# Patient Record
Sex: Female | Born: 1995 | Race: Black or African American | Hispanic: No | Marital: Single | Smoking: Never smoker
Health system: Southern US, Community
[De-identification: ages and names within clinical notes are randomized; demographics above are authoritative.]

## PROBLEM LIST (undated history)

## (undated) DIAGNOSIS — T7840XA Allergy, unspecified, initial encounter: Secondary | ICD-10-CM

## (undated) DIAGNOSIS — J45909 Unspecified asthma, uncomplicated: Secondary | ICD-10-CM

## (undated) HISTORY — DX: Unspecified asthma, uncomplicated: J45.909

## (undated) HISTORY — DX: Allergy, unspecified, initial encounter: T78.40XA

## (undated) HISTORY — PX: KNEE SURGERY: SHX244

---

## 2014-10-20 ENCOUNTER — Ambulatory Visit (INDEPENDENT_AMBULATORY_CARE_PROVIDER_SITE_OTHER): Payer: BLUE CROSS/BLUE SHIELD

## 2014-10-20 ENCOUNTER — Ambulatory Visit (INDEPENDENT_AMBULATORY_CARE_PROVIDER_SITE_OTHER): Payer: BLUE CROSS/BLUE SHIELD | Admitting: Family Medicine

## 2014-10-20 VITALS — BP 96/60 | HR 72 | Temp 98.2°F | Resp 16 | Ht 59.5 in | Wt 142.2 lb

## 2014-10-20 DIAGNOSIS — S20219A Contusion of unspecified front wall of thorax, initial encounter: Secondary | ICD-10-CM | POA: Diagnosis not present

## 2014-10-20 DIAGNOSIS — R079 Chest pain, unspecified: Secondary | ICD-10-CM | POA: Diagnosis not present

## 2014-10-20 MED ORDER — HYDROCODONE-ACETAMINOPHEN 5-325 MG PO TABS: 1.0000 | ORAL_TABLET | Freq: Every evening | ORAL | Status: DC | PRN

## 2014-10-20 NOTE — Patient Instructions (Signed)

## 2014-10-20 NOTE — Progress Notes (Signed)
° °  Subjective:    Patient ID: Samantha Cannon, female    DOB: Oct 10, 1995, 19 y.o.   MRN: [id number] This chart was scribed for Samantha SidleKurt Lauenstein, MD by Littie Deedsichard Sun, Medical Scribe. This patient was seen in Room 2 and the patient's care was started at 3:40 PM.   HPI HPI Comments: [name] is a 19 y.o. female who presents to the [facility] complaining of a fall that occurred last night. Patient slipped and fell at a hotel shower, hitting her sternum on the shower bar. She reports having difficulty breathing due to the pain. The pain is worsened with deep breaths, laying down and sitting up. She notes it was uncomfortable to sleep last night. Palpation does not seem to make the pain worse. She has taken ibuprofen with some relief. Patient denies head injury, teeth injury and known bruising.  Patient is a Consulting civil engineerstudent at [employer], Physiological scientiststudying mechanical engineering.  Review of Systems  Cardiovascular: Positive for chest pain.  Skin: Negative for wound.       Objective:   Physical Exam CONSTITUTIONAL: Well developed/well nourished HEAD: Normocephalic/atraumatic EYES: EOM/PERRL ENMT: Mucous membranes moist NECK: supple no meningeal signs SPINE: entire spine nontender CV: S1/S2 noted, no murmurs/rubs/gallops noted LUNGS: Lungs are clear to auscultation bilaterally, no apparent distress ABDOMEN: soft, nontender, no rebound or guarding GU: no cva tenderness NEURO: Pt is awake/alert, moves all extremitiesx4 EXTREMITIES: pulses normal, full ROM SKIN: warm, color normal PSYCH: no abnormalities of mood noted  [facility] reading (PRIMARY) by  Dr. Milus GlazierLauenstein:  Chest film negative for acute injury.      Assessment & Plan:   This chart was scribed in my presence and reviewed by me personally.    ICD-9-CM ICD-10-CM   1. Chest pain, unspecified chest pain type 786.50 R07.9 DG Chest 2 View     HYDROcodone-acetaminophen (NORCO) 5-325 MG per tablet  2. Chest wall contusion, unspecified  laterality, initial encounter 922.1 S20.219A HYDROcodone-acetaminophen (NORCO) 5-325 MG per tablet     Signed, Samantha SidleKurt Lauenstein, MD

## 2015-06-06 ENCOUNTER — Ambulatory Visit (INDEPENDENT_AMBULATORY_CARE_PROVIDER_SITE_OTHER): Payer: BLUE CROSS/BLUE SHIELD | Admitting: Emergency Medicine

## 2015-06-06 VITALS — BP 110/70 | HR 67 | Temp 98.4°F | Resp 16 | Ht 59.5 in | Wt 141.0 lb

## 2015-06-06 DIAGNOSIS — J029 Acute pharyngitis, unspecified: Secondary | ICD-10-CM

## 2015-06-06 LAB — POCT RAPID STREP A (OFFICE): Rapid Strep A Screen: NEGATIVE

## 2015-06-06 NOTE — Progress Notes (Signed)
Subjective:  Patient ID: Samantha Cannon, female    DOB: 1996-07-17  Age: 19 y.o. MRN: [id number]  CC: Sore Throat   HPI Areeba Delamater presents  patient has sore throat for a week. She's had no fever chills. She has had no nasal congestion postnasal drainage no cough or wheezing or shortness of breath. She has not been exposed to strep she has a history of previous tonsillitis. She is eating and drinking well with no impairment. She did tolerate the pain with Tylenol  History Silena has a past medical history of Allergy and Asthma.   She has past surgical history that includes Knee surgery.   Her  family history is not on file.  She   reports that she has never smoked. She has never used smokeless tobacco. She reports that she does not drink alcohol or use illicit drugs.  Outpatient Prescriptions Prior to Visit  Medication Sig Dispense Refill  . HYDROcodone-acetaminophen (NORCO) 5-325 MG per tablet Take 1 tablet by mouth at bedtime as needed for moderate pain. 12 tablet 0   No facility-administered medications prior to visit.    Social History   Social History  . Marital Status: Single    Spouse Name: N/A  . Number of Children: N/A  . Years of Education: N/A   Social History Main Topics  . Smoking status: Never Smoker   . Smokeless tobacco: Never Used  . Alcohol Use: No  . Drug Use: No  . Sexual Activity: Not Asked   Other Topics Concern  . None   Social History Narrative     Review of Systems  Constitutional: Negative for fever, chills and appetite change.  HENT: Positive for sore throat. Negative for congestion, ear pain, postnasal drip and sinus pressure.   Eyes: Negative for pain and redness.  Respiratory: Negative for cough, shortness of breath and wheezing.   Cardiovascular: Negative for leg swelling.  Gastrointestinal: Negative for nausea, vomiting, abdominal pain, diarrhea, constipation and blood in stool.  Endocrine: Negative for polyuria.  Genitourinary:  Negative for dysuria, urgency, frequency and flank pain.  Musculoskeletal: Negative for gait problem.  Skin: Negative for rash.  Neurological: Negative for weakness and headaches.  Hematological: Positive for adenopathy.  Psychiatric/Behavioral: Negative for confusion and decreased concentration. The patient is not nervous/anxious.     Objective:  BP 110/70 mmHg  Pulse 67  Temp(Src) 98.4 F (36.9 C) (Oral)  Resp 16  Ht 4' 11.5" (1.511 m)  Wt 141 lb (63.957 kg)  BMI 28.01 kg/m2  SpO2 97%  LMP 06/03/2015  Physical Exam  Constitutional: She is oriented to person, place, and time. She appears well-developed and well-nourished.  HENT:  Head: Normocephalic and atraumatic.  Eyes: Conjunctivae are normal. Pupils are equal, round, and reactive to light.  Pulmonary/Chest: Effort normal.  Musculoskeletal: She exhibits no edema.  Neurological: She is alert and oriented to person, place, and time.  Skin: Skin is dry.  Psychiatric: She has a normal mood and affect. Her behavior is normal. Thought content normal.      Assessment & Plan:   Ajane was seen today for sore throat.  Diagnoses and all orders for this visit:  Pharyngitis -     POCT rapid strep A  I have discontinued Ms. Sligar's HYDROcodone-acetaminophen. I am also having her maintain her levonorgestrel-ethinyl estradiol.  Meds ordered this encounter  Medications  . levonorgestrel-ethinyl estradiol (AVIANE,ALESSE,LESSINA) 0.1-20 MG-MCG tablet    Sig: Take 1 tablet by mouth daily.    Appropriate  red flag conditions were discussed with the patient as well as actions that should be taken.  Patient expressed his understanding.  Follow-up: Return if symptoms worsen or fail to improve.  Carmelina Dane, MD    Results for orders placed or performed in visit on 06/06/15  POCT rapid strep A  Result Value Ref Range   Rapid Strep A Screen Negative Negative

## 2015-06-06 NOTE — Patient Instructions (Signed)

## 2015-08-31 IMAGING — CR DG CHEST 2V
2 series · 2 of 2 positions shown · non-contrast
Comparison: None.

CLINICAL DATA: complaining of a fall that occurred last night.
Patient slipped and fell at a hotel shower, hitting her sternum on
the shower bar. She reports having difficulty breathing due to the
pain. The pain is worsened with deep breaths, laying down and
sitting up.

EXAM:
CHEST  2 VIEW

[PA]
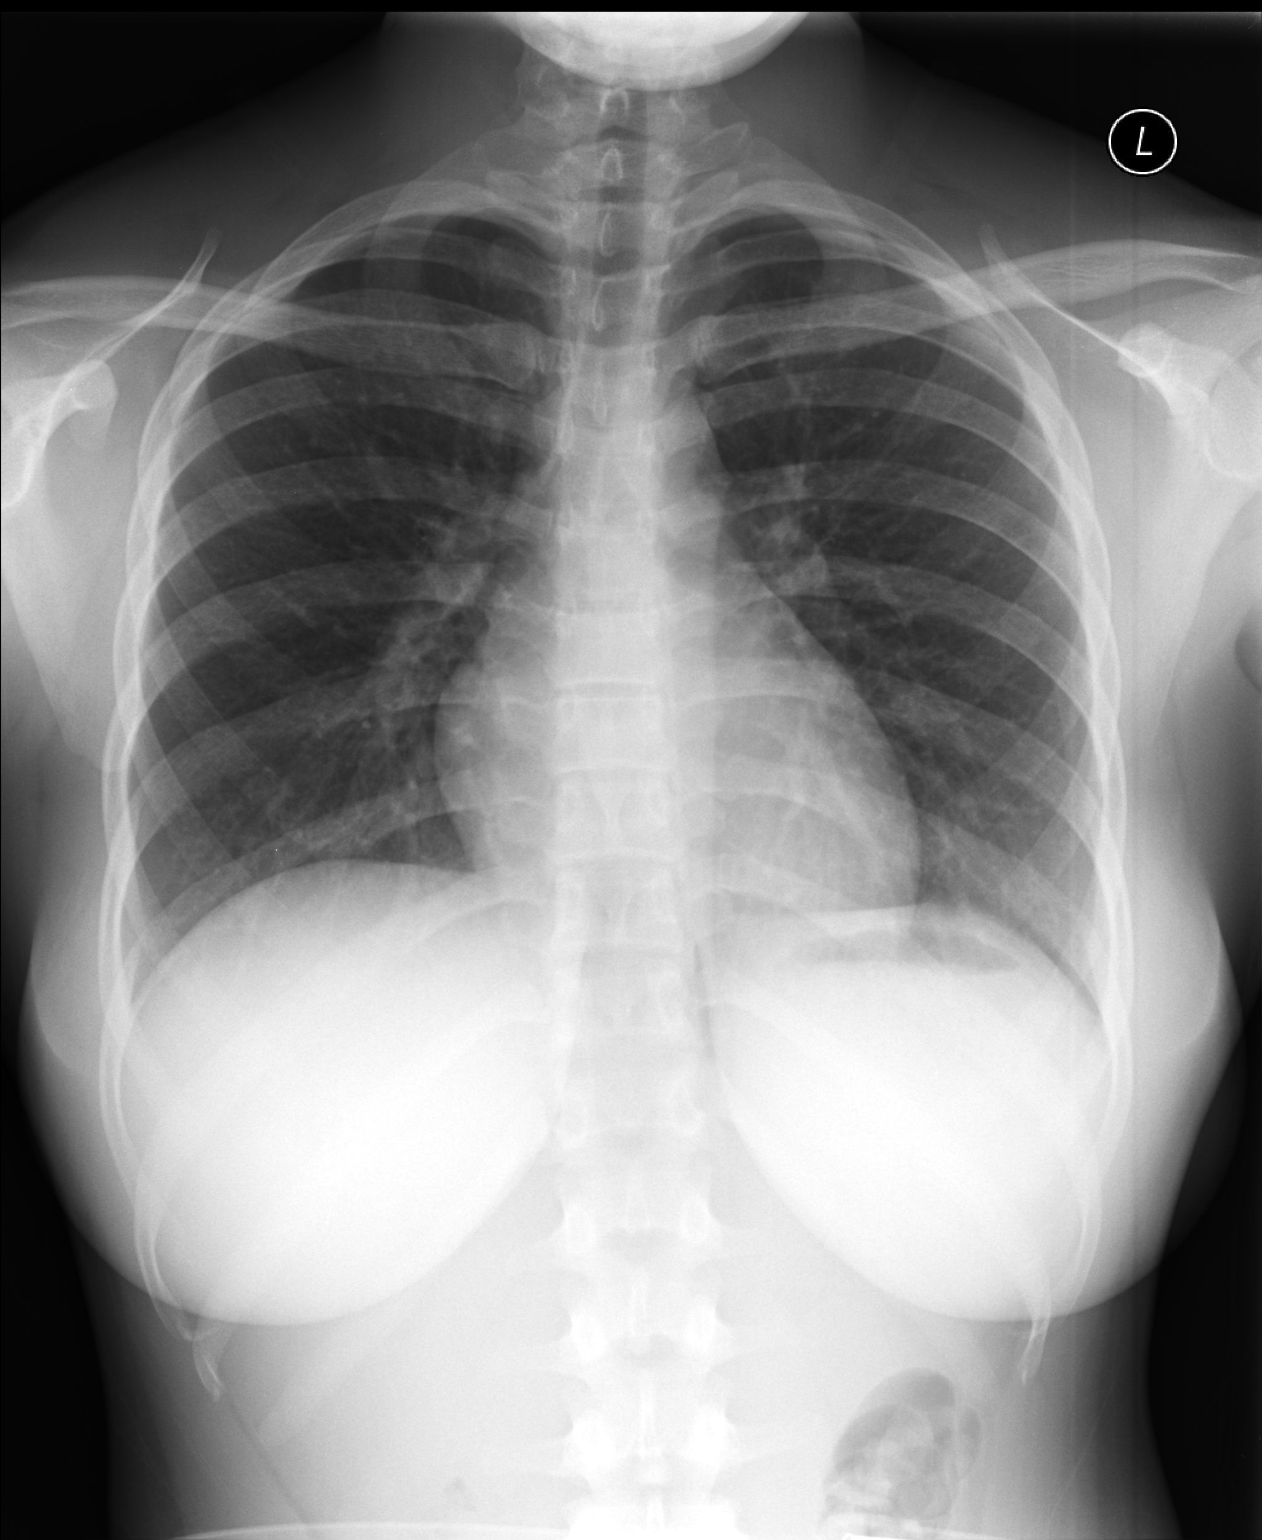

[lateral]
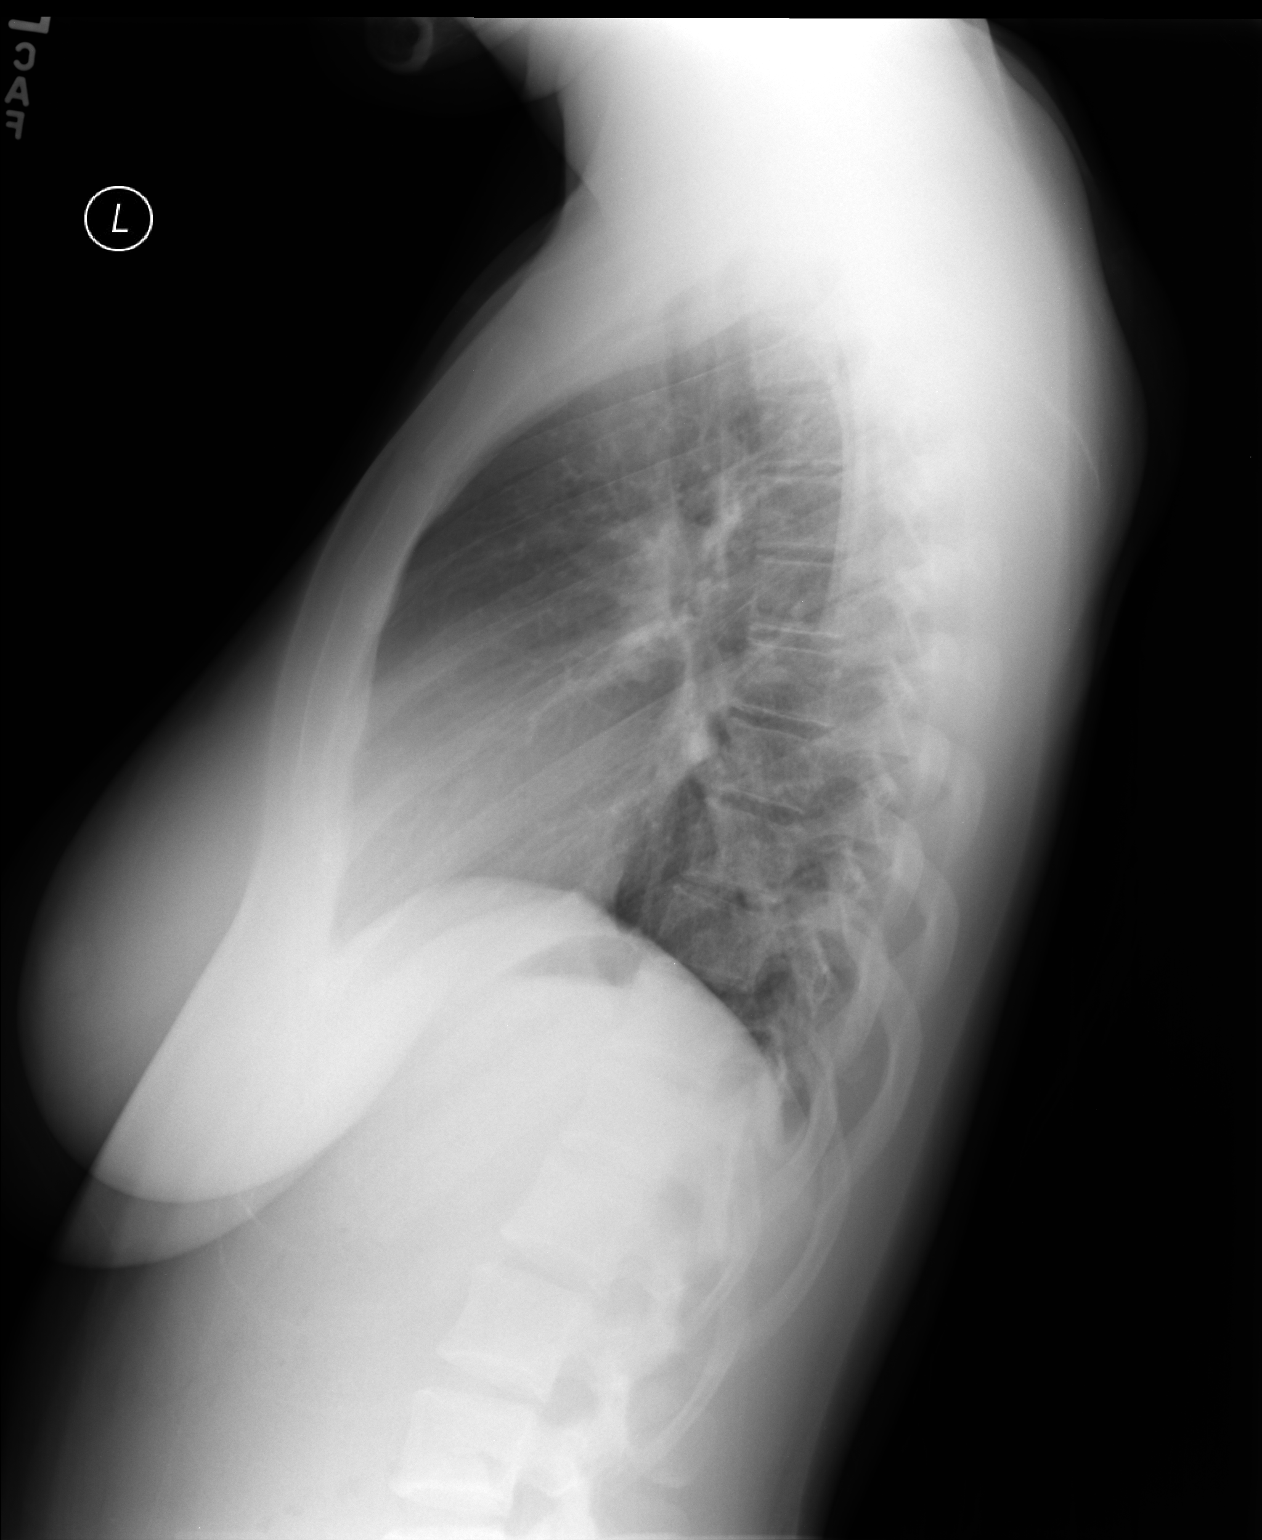

[2 of 2 positions shown; findings below may reference images not displayed]

FINDINGS: The heart size and mediastinal contours are within normal limits.
Both lungs are clear. The visualized skeletal structures are
unremarkable.
IMPRESSION: No active cardiopulmonary disease.

## 2016-05-05 ENCOUNTER — Ambulatory Visit (HOSPITAL_COMMUNITY)
Admission: EM | Admit: 2016-05-05 | Discharge: 2016-05-05 | Disposition: A | Discharge status: Home / Self Care | Payer: BLUE CROSS/BLUE SHIELD | Attending: Family Medicine | Admitting: Family Medicine

## 2016-05-05 ENCOUNTER — Encounter (HOSPITAL_COMMUNITY): Payer: Self-pay | Admitting: *Deleted

## 2016-05-05 DIAGNOSIS — B86 Scabies: Secondary | ICD-10-CM | POA: Diagnosis not present

## 2016-05-05 MED ORDER — PERMETHRIN 5 % EX CREA
TOPICAL_CREAM | CUTANEOUS | 1 refills | Status: AC
Start: 2016-05-05 — End: ?

## 2016-05-05 NOTE — ED Provider Notes (Signed)
[facility]    CSN: [id number] Arrival date & time: 05/05/16  1658     History   Chief Complaint Chief Complaint  Patient presents with  . Rash    HPI Samantha Cannon is a 20 y.o. female.   The history is provided by the patient.  Rash  Location:  Full body Quality: itchiness and redness   Severity:  Mild Onset quality:  Gradual Duration:  2 weeks Progression:  Spreading Chronicity:  New Relieved by:  None tried Ineffective treatments:  None tried Associated symptoms: no fever     Past Medical History:  Diagnosis Date  . Allergy   . Asthma     There are no active problems to display for this patient.   Past Surgical History:  Procedure Laterality Date  . KNEE SURGERY      OB History    No data available       Home Medications    Prior to Admission medications   Medication Sig Start Date End Date Taking? Authorizing Provider  levonorgestrel-ethinyl estradiol (AVIANE,ALESSE,LESSINA) 0.1-20 MG-MCG tablet Take 1 tablet by mouth daily.    Historical Provider, MD    Family History History reviewed. No pertinent family history.  Social History Social History  Substance Use Topics  . Smoking status: Never Smoker  . Smokeless tobacco: Never Used  . Alcohol use No     Allergies   Penicillins   Review of Systems Review of Systems  Constitutional: Negative.  Negative for fever.  Skin: Positive for rash.  All other systems reviewed and are negative.    Physical Exam Triage Vital Signs ED Triage Vitals [05/05/16 1750]  Enc Vitals Group     BP 119/69     Pulse Rate 60     Resp 12     Temp 99.5 F (37.5 C)     Temp Source Oral     SpO2 100 %     Weight      Height      Head Circumference      Peak Flow      Pain Score      Pain Loc      Pain Edu?      Excl. in GC?    No data found.   Updated Vital Signs BP 119/69 (BP Location: Left Arm)   Pulse 60   Temp 99.5 F (37.5 C) (Oral)   Resp 12   LMP 04/25/2016   SpO2  100%   Visual Acuity Right Eye Distance:   Left Eye Distance:   Bilateral Distance:    Right Eye Near:   Left Eye Near:    Bilateral Near:     Physical Exam  Constitutional: She appears well-developed and well-nourished. No distress.  Skin: Skin is warm and dry. Rash noted.  Papular rash on hands, forearms, legs, torso., nonpustular.  Nursing note and vitals reviewed.    UC Treatments / Results  Labs (all labs ordered are listed, but only abnormal results are displayed) Labs Reviewed - No data to display  EKG  EKG Interpretation None       Radiology No results found.  Procedures Procedures (including critical care time)  Medications Ordered in UC Medications - No data to display   Initial Impression / Assessment and Plan / UC Course  I have reviewed the triage vital signs and the nursing notes.  Pertinent labs & imaging results that were available during my care of the patient were reviewed by  me and considered in my medical decision making (see chart for details).  Clinical Course      Final Clinical Impressions(s) / UC Diagnoses   Final diagnoses:  None    New Prescriptions New Prescriptions   No medications on file     Linna Hoff, MD 05/05/16 1857

## 2016-05-05 NOTE — ED Triage Notes (Signed)
Pt  Reports   Symptoms  Of  Rash  That  Itches       X  2  Weeks  -    Pt        Started          Some  new  Detergent    sev weeks  Prior  To  The  Breakout      No   angioedema  Pt    Sitting  Upright  On the  Exam table     In no  Distress

## 2016-05-06 ENCOUNTER — Ambulatory Visit: Payer: BLUE CROSS/BLUE SHIELD
